# Patient Record
Sex: Female | Born: 1960 | Race: White | Hispanic: No | Marital: Single | Smoking: Current every day smoker
Health system: Southern US, Community
[De-identification: ages and names within clinical notes are randomized; demographics above are authoritative.]

## PROBLEM LIST (undated history)

## (undated) DIAGNOSIS — I10 Essential (primary) hypertension: Secondary | ICD-10-CM

---

## 2013-04-22 ENCOUNTER — Emergency Department (HOSPITAL_BASED_OUTPATIENT_CLINIC_OR_DEPARTMENT_OTHER)
Admission: EM | Admit: 2013-04-22 | Discharge: 2013-04-22 | Disposition: A | Discharge status: Home / Self Care | Payer: 59 | Attending: Emergency Medicine | Admitting: Emergency Medicine

## 2013-04-22 ENCOUNTER — Encounter (HOSPITAL_BASED_OUTPATIENT_CLINIC_OR_DEPARTMENT_OTHER): Payer: Self-pay | Admitting: Emergency Medicine

## 2013-04-22 DIAGNOSIS — Y929 Unspecified place or not applicable: Secondary | ICD-10-CM | POA: Insufficient documentation

## 2013-04-22 DIAGNOSIS — Z23 Encounter for immunization: Secondary | ICD-10-CM | POA: Insufficient documentation

## 2013-04-22 DIAGNOSIS — W010XXA Fall on same level from slipping, tripping and stumbling without subsequent striking against object, initial encounter: Secondary | ICD-10-CM | POA: Insufficient documentation

## 2013-04-22 DIAGNOSIS — S0100XA Unspecified open wound of scalp, initial encounter: Secondary | ICD-10-CM | POA: Insufficient documentation

## 2013-04-22 DIAGNOSIS — F17200 Nicotine dependence, unspecified, uncomplicated: Secondary | ICD-10-CM | POA: Insufficient documentation

## 2013-04-22 DIAGNOSIS — W19XXXA Unspecified fall, initial encounter: Secondary | ICD-10-CM

## 2013-04-22 DIAGNOSIS — S0101XA Laceration without foreign body of scalp, initial encounter: Secondary | ICD-10-CM

## 2013-04-22 DIAGNOSIS — Y939 Activity, unspecified: Secondary | ICD-10-CM | POA: Insufficient documentation

## 2013-04-22 DIAGNOSIS — I10 Essential (primary) hypertension: Secondary | ICD-10-CM | POA: Insufficient documentation

## 2013-04-22 HISTORY — DX: Essential (primary) hypertension: I10

## 2013-04-22 MED ORDER — HYDROCODONE-ACETAMINOPHEN 5-325 MG PO TABS: 2.0000 | ORAL_TABLET | ORAL | Status: DC | PRN

## 2013-04-22 MED ORDER — TETANUS-DIPHTH-ACELL PERTUSSIS 5-2.5-18.5 LF-MCG/0.5 IM SUSP
0.5000 mL | Freq: Once | INTRAMUSCULAR | Status: AC
  Administered 2013-04-22: 0.5 mL via INTRAMUSCULAR
  Filled 2013-04-22: qty 0.5

## 2013-04-22 NOTE — ED Notes (Signed)
Pt states she was wearing fuzzy socks and slipped when walking through the kitchen she hit back of her head on the corner of a desk in the kitchen no loss of consciousness no blurred vision. States her daughter placed ice in it and flushed with peroxide. Bleeding controlled at present.

## 2013-04-22 NOTE — ED Provider Notes (Signed)
CSN: [id number]     Arrival date & time 04/22/13  2130 History   First MD Initiated Contact with Patient 04/22/13 1006     Chief Complaint  Patient presents with  . Head Injury   (Consider location/radiation/quality/duration/timing/severity/associated sxs/prior Treatment) HPI Comments: Patient is a 53 year old female with history of hypertension. She was wearing slippery socks and slipped and fell in the kitchen this morning. She hit the back of her head on the corner of the kitchen counter. There is no loss of consciousness. She denies significant headache, neck pain, or other injury. She sustained a laceration to the back of her scalp. Her last tetanus shot was unknown.  Patient is a 53 y.o. female presenting with head injury. The history is provided by the patient.  Head Injury Location:  Occipital Time since incident:  3 hours Mechanism of injury: fall   Pain details:    Severity:  No pain Chronicity:  New   Past Medical History  Diagnosis Date  . Hypertension    Past Surgical History  Procedure Laterality Date  . Cesarean section     History reviewed. No pertinent family history. History  Substance Use Topics  . Smoking status: Current Every Day Smoker  . Smokeless tobacco: Not on file  . Alcohol Use: Yes     Comment: glass of wine with dinner daily   OB History   Grav Para Term Preterm Abortions TAB SAB Ect Mult Living                 Review of Systems  All other systems reviewed and are negative.    Allergies  Sulfa antibiotics  Home Medications   Current Outpatient Rx  Name  Route  Sig  Dispense  Refill  . ALPRAZolam (XANAX) 0.25 MG tablet   Oral   Take 0.25 mg by mouth at bedtime as needed for anxiety.          BP 122/84  Pulse 96  Temp(Src) 97.5 F (36.4 C) (Oral)  Resp 20  SpO2 100%  LMP 04/02/2013 Physical Exam  Nursing note and vitals reviewed. Constitutional: She is oriented to person, place, and time. She appears well-developed and  well-nourished. No distress.  HENT:  Head: Normocephalic.  There is a 3.5 cm laceration to the occiput. Bleeding is controlled.  TMs are clear without hemotympanum.  Eyes: EOM are normal. Pupils are equal, round, and reactive to light.  Neck: Normal range of motion. Neck supple.  There is no cervical spine tenderness and she has painless range of motion in all directions.  Cardiovascular: Normal rate, regular rhythm and normal heart sounds.   Pulmonary/Chest: Effort normal and breath sounds normal.  Musculoskeletal: Normal range of motion. She exhibits no edema.  Lymphadenopathy:    She has no cervical adenopathy.  Neurological: She is alert and oriented to person, place, and time. No cranial nerve deficit. She exhibits normal muscle tone. Coordination normal.  Skin: Skin is warm and dry. She is not diaphoretic.    ED Course  Procedures (including critical care time) Labs Review Labs Reviewed - No data to display Imaging Review No results found.  LACERATION REPAIR Performed by: Geoffery Lyons Authorized by: Geoffery Lyons Consent: Verbal consent obtained. Risks and benefits: risks, benefits and alternatives were discussed Consent given by: patient Patient identity confirmed: provided demographic data Prepped and Draped in normal sterile fashion Wound explored  Laceration Location: Occiput  Laceration Length: 3.5 cm  No Foreign Bodies seen or palpated  Anesthesia:  local infiltration  Local anesthetic: lidocaine 1 % with epinephrine  Anesthetic total: 2 ml  Irrigation method: syringe Amount of cleaning: standard  Skin closure: Staples   Number of sutures: 5   Technique: Staples   Patient tolerance: Patient tolerated the procedure well with no immediate complications.   MDM  No diagnosis found. Patient presents after a fall with a scalp laceration. Laceration was repaired with staples. Cranial nerves are intact and there is no neck pain. She has painless range  of motion in all directions and her neuro exam is nonfocal. I see no indication for CT scan. She will be discharged with instructions to return if she develops severe headache or other concerning symptoms.    Geoffery Lyonsouglas Aliviya Schoeller, MD 04/22/13 1024

## 2013-04-22 NOTE — Discharge Instructions (Signed)
Staples out in 5 days.  Return to the ER if he develops severe headache, visual changes, vomiting, or other new or concerning symptoms.   Laceration Care, Adult A laceration is a cut or lesion that goes through all layers of the skin and into the tissue just beneath the skin. TREATMENT  Some lacerations may not require closure. Some lacerations may not be able to be closed due to an increased risk of infection. It is important to see your caregiver as soon as possible after an injury to minimize the risk of infection and maximize the opportunity for successful closure. If closure is appropriate, pain medicines may be given, if needed. The wound will be cleaned to help prevent infection. Your caregiver will use stitches (sutures), staples, wound glue (adhesive), or skin adhesive strips to repair the laceration. These tools bring the skin edges together to allow for faster healing and a better cosmetic outcome. However, all wounds will heal with a scar. Once the wound has healed, scarring can be minimized by covering the wound with sunscreen during the day for 1 full year. HOME CARE INSTRUCTIONS  For sutures or staples:  Keep the wound clean and dry.  If you were given a bandage (dressing), you should change it at least once a day. Also, change the dressing if it becomes wet or dirty, or as directed by your caregiver.  Wash the wound with soap and water 2 times a day. Rinse the wound off with water to remove all soap. Pat the wound dry with a clean towel.  After cleaning, apply a thin layer of the antibiotic ointment as recommended by your caregiver. This will help prevent infection and keep the dressing from sticking.  You may shower as usual after the first 24 hours. Do not soak the wound in water until the sutures are removed.  Only take over-the-counter or prescription medicines for pain, discomfort, or fever as directed by your caregiver.  Get your sutures or staples removed as directed  by your caregiver. For skin adhesive strips:  Keep the wound clean and dry.  Do not get the skin adhesive strips wet. You may bathe carefully, using caution to keep the wound dry.  If the wound gets wet, pat it dry with a clean towel.  Skin adhesive strips will fall off on their own. You may trim the strips as the wound heals. Do not remove skin adhesive strips that are still stuck to the wound. They will fall off in time. For wound adhesive:  You may briefly wet your wound in the shower or bath. Do not soak or scrub the wound. Do not swim. Avoid periods of heavy perspiration until the skin adhesive has fallen off on its own. After showering or bathing, gently pat the wound dry with a clean towel.  Do not apply liquid medicine, cream medicine, or ointment medicine to your wound while the skin adhesive is in place. This may loosen the film before your wound is healed.  If a dressing is placed over the wound, be careful not to apply tape directly over the skin adhesive. This may cause the adhesive to be pulled off before the wound is healed.  Avoid prolonged exposure to sunlight or tanning lamps while the skin adhesive is in place. Exposure to ultraviolet light in the first year will darken the scar.  The skin adhesive will usually remain in place for 5 to 10 days, then naturally fall off the skin. Do not pick at the adhesive  film. You may need a tetanus shot if:  You cannot remember when you had your last tetanus shot.  You have never had a tetanus shot. If you get a tetanus shot, your arm may swell, get red, and feel warm to the touch. This is common and not a problem. If you need a tetanus shot and you choose not to have one, there is a rare chance of getting tetanus. Sickness from tetanus can be serious. SEEK MEDICAL CARE IF:   You have redness, swelling, or increasing pain in the wound.  You see a red line that goes away from the wound.  You have yellowish-white fluid (pus) coming  from the wound.  You have a fever.  You notice a bad smell coming from the wound or dressing.  Your wound breaks open before or after sutures have been removed.  You notice something coming out of the wound such as wood or glass.  Your wound is on your hand or foot and you cannot move a finger or toe. SEEK IMMEDIATE MEDICAL CARE IF:   Your pain is not controlled with prescribed medicine.  You have severe swelling around the wound causing pain and numbness or a change in color in your arm, hand, leg, or foot.  Your wound splits open and starts bleeding.  You have worsening numbness, weakness, or loss of function of any joint around or beyond the wound.  You develop painful lumps near the wound or on the skin anywhere on your body. MAKE SURE YOU:   Understand these instructions.  Will watch your condition.  Will get help right away if you are not doing well or get worse. Document Released: 03/06/2005 Document Revised: 05/29/2011 Document Reviewed: 08/30/2010 [facility] Patient Information 2014 [place], [place].  Head Injury, Adult You have received a head injury. It does not appear serious at this time. Headaches and vomiting are common following head injury. It should be easy to awaken from sleeping. Sometimes it is necessary for you to stay in the emergency department for a while for observation. Sometimes admission to the hospital may be needed. After injuries such as yours, most problems occur within the first 24 hours, but side effects may occur up to 7 10 days after the injury. It is important for you to carefully monitor your condition and contact your health care provider or seek immediate medical care if there is a change in your condition. WHAT ARE THE TYPES OF HEAD INJURIES? Head injuries can be as minor as a bump. Some head injuries can be more severe. More severe head injuries include:  A jarring injury to the brain (concussion).  A bruise of the brain (contusion). This  mean there is bleeding in the brain that can cause swelling.  A cracked skull (skull fracture).  Bleeding in the brain that collects, clots, and forms a bump (hematoma). WHAT CAUSES A HEAD INJURY? A serious head injury is most likely to happen to someone who is in a car wreck and is not wearing a seat belt. Other causes of major head injuries include bicycle or motorcycle accidents, sports injuries, and falls. HOW ARE HEAD INJURIES DIAGNOSED? A complete history of the event leading to the injury and your current symptoms will be helpful in diagnosing head injuries. Many times, pictures of the brain, such as CT or MRI are needed to see the extent of the injury. Often, an overnight hospital stay is necessary for observation.  WHEN SHOULD I SEEK IMMEDIATE MEDICAL CARE?  You  should get help right away if:  You have confusion or drowsiness.  You feel sick to your stomach (nauseous) or have continued, forceful vomiting.  You have dizziness or unsteadiness that is getting worse.  You have severe, continued headaches not relieved by medicine. Only take over-the-counter or prescription medicines for pain, fever, or discomfort as directed by your health care provider.  You do not have normal function of the arms or legs or are unable to walk.  You notice changes in the black spots in the center of the colored part of your eye (pupil).  You have a clear or bloody fluid coming from your nose or ears.  You have a loss of vision. During the next 24 hours after the injury, you must stay with someone who can watch you for the warning signs. This person should contact local emergency services (911 in the [place] if you have seizures, you become unconscious, or you are unable to wake up. HOW CAN I PREVENT A HEAD INJURY IN THE FUTURE? The most important factor for preventing major head injuries is avoiding motor vehicle accidents. To minimize the potential for damage to your head, it is crucial to wear seat  belts while riding in motor vehicles. Wearing helmets while bike riding and playing collision sports (like football) is also helpful. Also, avoiding dangerous activities around the house will further help reduce your risk of head injury.  WHEN CAN I RETURN TO NORMAL ACTIVITIES AND ATHLETICS? You should be reevaluated by your health care provider before returning to these activities. If you have any of the following symptoms, you should not return to activities or contact sports until 1 week after the symptoms have stopped:  Persistent headache.  Dizziness or vertigo.  Poor attention and concentration.  Confusion.  Memory problems.  Nausea or vomiting.  Fatigue or tire easily.  Irritability.  Intolerant of bright lights or loud noises.  Anxiety or depression.  Disturbed sleep. MAKE SURE YOU:   Understand these instructions.  Will watch your condition.  Will get help right away if you are not doing well or get worse. Document Released: 03/06/2005 Document Revised: 12/25/2012 Document Reviewed: 11/11/2012 [facility] Patient Information 2014 [place], [place].

## 2021-02-28 ENCOUNTER — Encounter (HOSPITAL_BASED_OUTPATIENT_CLINIC_OR_DEPARTMENT_OTHER): Payer: Self-pay | Admitting: *Deleted

## 2021-02-28 ENCOUNTER — Emergency Department (HOSPITAL_BASED_OUTPATIENT_CLINIC_OR_DEPARTMENT_OTHER): Payer: BC Managed Care – PPO

## 2021-02-28 ENCOUNTER — Emergency Department (HOSPITAL_BASED_OUTPATIENT_CLINIC_OR_DEPARTMENT_OTHER)
Admission: EM | Admit: 2021-02-28 | Discharge: 2021-02-28 | Disposition: A | Discharge status: Home / Self Care | Payer: BC Managed Care – PPO | Attending: Emergency Medicine | Admitting: Emergency Medicine

## 2021-02-28 ENCOUNTER — Other Ambulatory Visit: Payer: Self-pay

## 2021-02-28 DIAGNOSIS — F17200 Nicotine dependence, unspecified, uncomplicated: Secondary | ICD-10-CM | POA: Diagnosis not present

## 2021-02-28 DIAGNOSIS — S42032A Displaced fracture of lateral end of left clavicle, initial encounter for closed fracture: Secondary | ICD-10-CM

## 2021-02-28 DIAGNOSIS — W109XXA Fall (on) (from) unspecified stairs and steps, initial encounter: Secondary | ICD-10-CM | POA: Diagnosis not present

## 2021-02-28 DIAGNOSIS — I10 Essential (primary) hypertension: Secondary | ICD-10-CM | POA: Insufficient documentation

## 2021-02-28 DIAGNOSIS — Y9289 Other specified places as the place of occurrence of the external cause: Secondary | ICD-10-CM | POA: Diagnosis not present

## 2021-02-28 DIAGNOSIS — S40012A Contusion of left shoulder, initial encounter: Secondary | ICD-10-CM | POA: Diagnosis not present

## 2021-02-28 DIAGNOSIS — S40912A Unspecified superficial injury of left shoulder, initial encounter: Secondary | ICD-10-CM | POA: Diagnosis present

## 2021-02-28 MED ORDER — OXYCODONE-ACETAMINOPHEN 5-325 MG PO TABS
1.0000 | ORAL_TABLET | Freq: Once | ORAL | Status: AC
  Administered 2021-02-28: 1 via ORAL
  Filled 2021-02-28: qty 1

## 2021-02-28 MED ORDER — OXYCODONE-ACETAMINOPHEN 5-325 MG PO TABS: 1.0000 | ORAL_TABLET | Freq: Four times a day (QID) | ORAL | 0 refills | Status: DC | PRN

## 2021-02-28 NOTE — ED Provider Notes (Signed)
[facility] EMERGENCY DEPARTMENT Provider Note   CSN: [id number] Arrival date & time: 02/28/21  1635     History Chief Complaint  Patient presents with   Carolyn Leblanc is a 60 y.o. female.   Fall Pertinent negatives include no chest pain, no abdominal pain and no shortness of breath. Patient presents after fall.  Slipped on the steps after attempting to run down him to get back to resume eating.  Landed on her left shoulder.  Complaining of pain in left shoulder.  Did not hit head.  No other injury or complaint at this time.  No neck pain.  Not on blood thinners.  Pain with moving the upper extremity but no numbness or weakness.  No chest pain.     Past Medical History:  Diagnosis Date   Hypertension     There are no problems to display for this patient.   Past Surgical History:  Procedure Laterality Date   CESAREAN SECTION       OB History   No obstetric history on file.     No family history on file.  Social History   Tobacco Use   Smoking status: Every Day  Vaping Use   Vaping Use: Never used  Substance Use Topics   Alcohol use: Yes    Comment: glass of wine with dinner daily   Drug use: No    Home Medications Prior to Admission medications   Medication Sig Start Date End Date Taking? Authorizing Provider  oxyCODONE-acetaminophen (PERCOCET/ROXICET) 5-325 MG tablet Take 1 tablet by mouth every 6 (six) hours as needed for severe pain. 02/28/21  Yes Benjiman Core, MD  ALPRAZolam Prudy Feeler) 0.25 MG tablet Take 0.25 mg by mouth at bedtime as needed for anxiety.    [provider]    Allergies    Sulfa antibiotics  Review of Systems   Review of Systems  Constitutional:  Negative for appetite change.  Respiratory:  Negative for shortness of breath.   Cardiovascular:  Negative for chest pain.  Gastrointestinal:  Negative for abdominal pain.  Genitourinary:  Negative for flank pain.  Musculoskeletal:        Left shoulder  pain.  Skin:  Negative for wound.  Neurological:  Negative for weakness.   Physical Exam Updated Vital Signs BP (!) 147/86 (BP Location: Right Arm)   Pulse 97   Temp 98 F (36.7 C) (Oral)   Resp 18   Ht 5\' 8"  (1.727 m)   Wt 66.2 kg   SpO2 98%   BMI 22.20 kg/m   Physical Exam Vitals and nursing note reviewed.  HENT:     Head: Atraumatic.  Eyes:     Pupils: Pupils are equal, round, and reactive to light.  Cardiovascular:     Rate and Rhythm: Regular rhythm.  Chest:     Chest wall: No tenderness.  Abdominal:     Tenderness: There is no abdominal tenderness.  Musculoskeletal:        General: Tenderness present.     Cervical back: Neck supple. No tenderness.     Comments: Some ecchymosis over lateral left shoulder.  Tenderness over distal clavicle on left.  Decreased range of motion in in shoulder due to pain.  No tenderness over elbow wrist or hand.  Sensation intact in left hand.  Radial pulse strong.  No chest wall tenderness.  Skin:    Capillary Refill: Capillary refill takes less than 2 seconds.  Neurological:  Mental Status: She is alert and oriented to person, place, and time.    ED Results / Procedures / Treatments   Labs (all labs ordered are listed, but only abnormal results are displayed) Labs Reviewed - No data to display  EKG None  Radiology DG Shoulder Left  Result Date: 02/28/2021 CLINICAL DATA:  Fall, rule out fracture EXAM: LEFT SHOULDER - 2+ VIEW COMPARISON:  None. FINDINGS: Displaced fracture of the distal 1/3 of the clavicle. There is approximately 1 cm inferior and 8 mm lateral displacement of the distal fracture fragment. The glenohumeral joint appear maintained. Visualized lungs are clear. IMPRESSION: Displaced fracture of the distal 1/3 of the clavicle. Electronically Signed   By: Larose Hires D.O.   On: 02/28/2021 17:24    Procedures Procedures   Medications Ordered in ED Medications  oxyCODONE-acetaminophen (PERCOCET/ROXICET) 5-325 MG  per tablet 1 tablet (1 tablet Oral Given 02/28/21 1828)    ED Course  I have reviewed the triage vital signs and the nursing notes.  Pertinent labs & imaging results that were available during my care of the patient were reviewed by me and considered in my medical decision making (see chart for details).    MDM Rules/Calculators/A&P                           Patient with fall.  Only apparent injury is left distal clavicle.  Some displacement.  Skin is not tented.  Neurovascular intact.  Sling given.  We will have him follow-up with sports medicine or the orthopedic surgeon that she is seen previously.  Hopefully will not require surgery.  Will discharge home with some pain medicine. Final Clinical Impression(s) / ED Diagnoses Final diagnoses:  Closed displaced fracture of acromial end of left clavicle, initial encounter    Rx / DC Orders ED Discharge Orders          Ordered    oxyCODONE-acetaminophen (PERCOCET/ROXICET) 5-325 MG tablet  Every 6 hours PRN        02/28/21 1841             Benjiman Core, MD 02/28/21 1844

## 2021-02-28 NOTE — ED Triage Notes (Signed)
She slipped on carpet covered steps and fell. Injury to her left shoulder.

## 2021-03-03 ENCOUNTER — Ambulatory Visit: Payer: BC Managed Care – PPO | Admitting: Family Medicine

## 2021-03-03 ENCOUNTER — Encounter: Payer: Self-pay | Admitting: Family Medicine

## 2021-03-03 VITALS — BP 142/80 | Ht 68.0 in | Wt 146.0 lb

## 2021-03-03 DIAGNOSIS — S42032A Displaced fracture of lateral end of left clavicle, initial encounter for closed fracture: Secondary | ICD-10-CM

## 2021-03-03 MED ORDER — HYDROCODONE-ACETAMINOPHEN 5-325 MG PO TABS: 1.0000 | ORAL_TABLET | Freq: Three times a day (TID) | ORAL | 0 refills | Status: AC | PRN

## 2021-03-03 NOTE — Progress Notes (Signed)
°  Carolyn Leblanc - 60 y.o. female MRN [id number]  Date of birth: Dec 08, 1960  SUBJECTIVE:  Including CC & ROS.  No chief complaint on file.   Carolyn Leblanc is a 60 y.o. female that is presenting with left shoulder pain.  She had a fall at her house and hit a vacuum.  She is found to have a fracture of the clavicle.  She is having significant pain and swelling over the area.  Denies any numbness or tingling..  Independent review of the left shoulder x-ray from 12/12 shows a distal displaced clavicle fracture.   Review of Systems See HPI   HISTORY: Past Medical, Surgical, Social, and Family History Reviewed & Updated per EMR.   Pertinent Historical Findings include:  Past Medical History:  Diagnosis Date   Hypertension     Past Surgical History:  Procedure Laterality Date   CESAREAN SECTION      History reviewed. No pertinent family history.  Social History   Socioeconomic History   Marital status: Single    Spouse name: Not on file   Number of children: Not on file   Years of education: Not on file   Highest education level: Not on file  Occupational History   Not on file  Tobacco Use   Smoking status: Every Day   Smokeless tobacco: Not on file  Vaping Use   Vaping Use: Never used  Substance and Sexual Activity   Alcohol use: Yes    Comment: glass of wine with dinner daily   Drug use: No   Sexual activity: Yes  Other Topics Concern   Not on file  Social History Narrative   Not on file   Social Determinants of Health   Financial Resource Strain: Not on file  Food Insecurity: Not on file  Transportation Needs: Not on file  Physical Activity: Not on file  Stress: Not on file  Social Connections: Not on file  Intimate Partner Violence: Not on file     PHYSICAL EXAM:  VS: BP (!) 142/80 (BP Location: Left Arm, Patient Position: Sitting)    Ht 5\' 8"  (1.727 m)    Wt 146 lb (66.2 kg)    BMI 22.20 kg/m  Physical Exam Gen: NAD, alert, cooperative with exam,  well-appearing    ASSESSMENT & PLAN:   Closed displaced fracture of lateral end of left clavicle Injury occurred on 12/12.  Type IIb distal clavicle fracture.  Having a good amount of pain.  -Counseled on home exercise therapy and supportive care. -Norco. -Counseled on sling. -Provided work note. -Referral orthopedist.

## 2021-03-03 NOTE — Assessment & Plan Note (Signed)
Injury occurred on 12/12.  Type IIb distal clavicle fracture.  Having a good amount of pain.  -Counseled on home exercise therapy and supportive care. -Norco. -Counseled on sling. -Provided work note. -Referral orthopedist.

## 2021-03-03 NOTE — Patient Instructions (Signed)
Nice to meet you Please use ice as needed  Please continue the sling  Please use norco as needed   Please send me a message in MyChart with any questions or updates.  Please see me back as needed.   --Dr. Jordan Likes

## 2022-07-05 ENCOUNTER — Encounter: Payer: Self-pay | Admitting: *Deleted

## 2023-08-13 IMAGING — DX DG SHOULDER 2+V*L*
3 series · 3 of 3 positions shown · non-contrast
Comparison: None.

CLINICAL DATA: Fall, rule out fracture

EXAM:
LEFT SHOULDER - 2+ VIEW

[shoulder grashey]
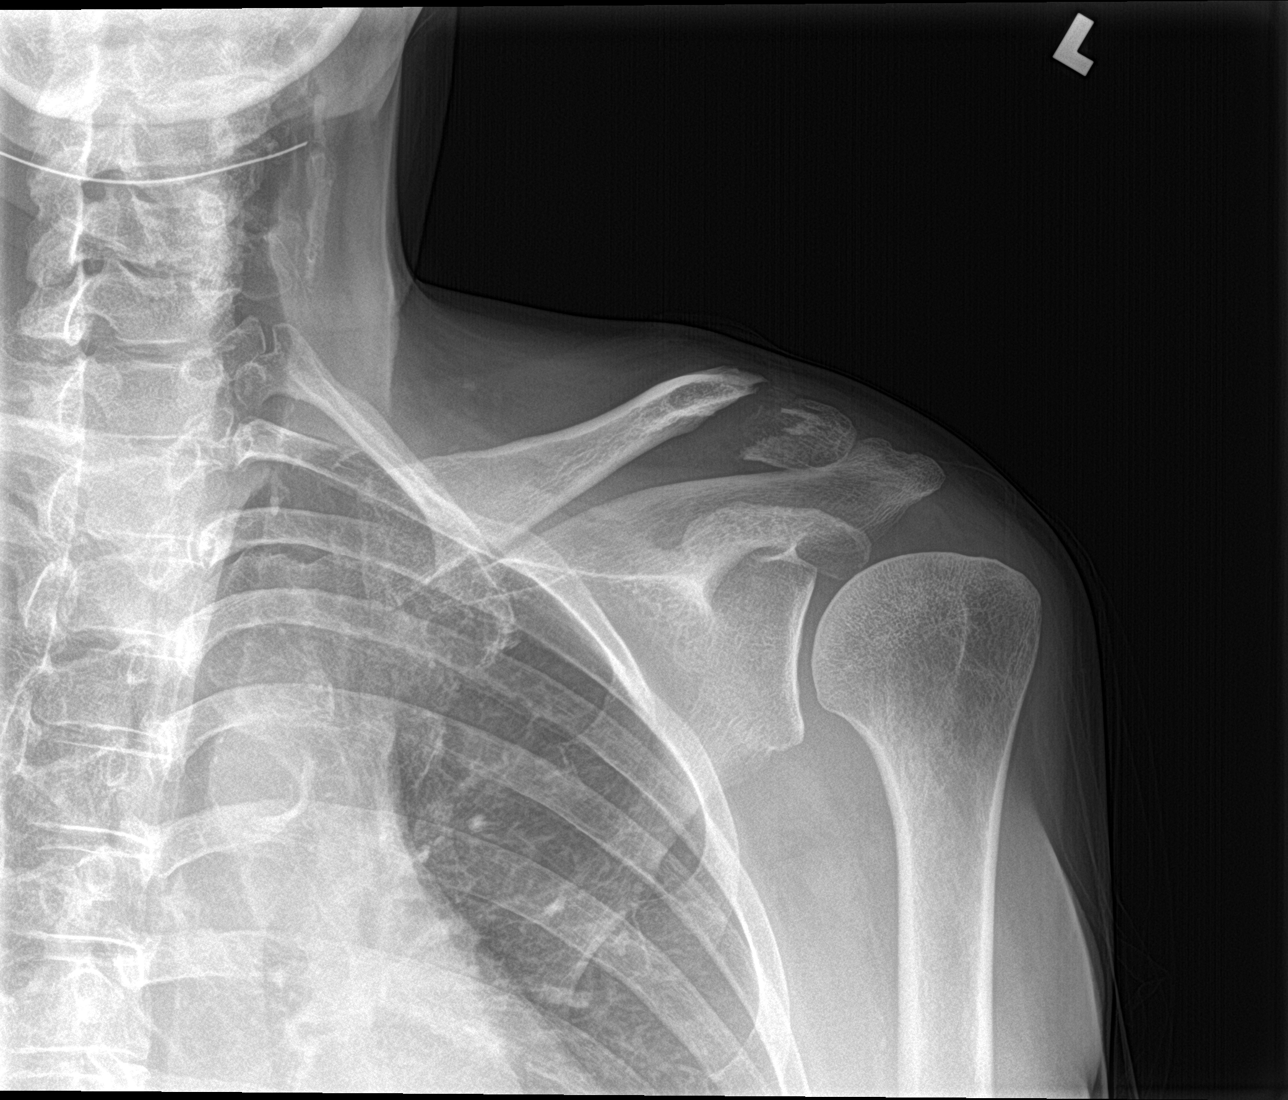

[shoulder y view]
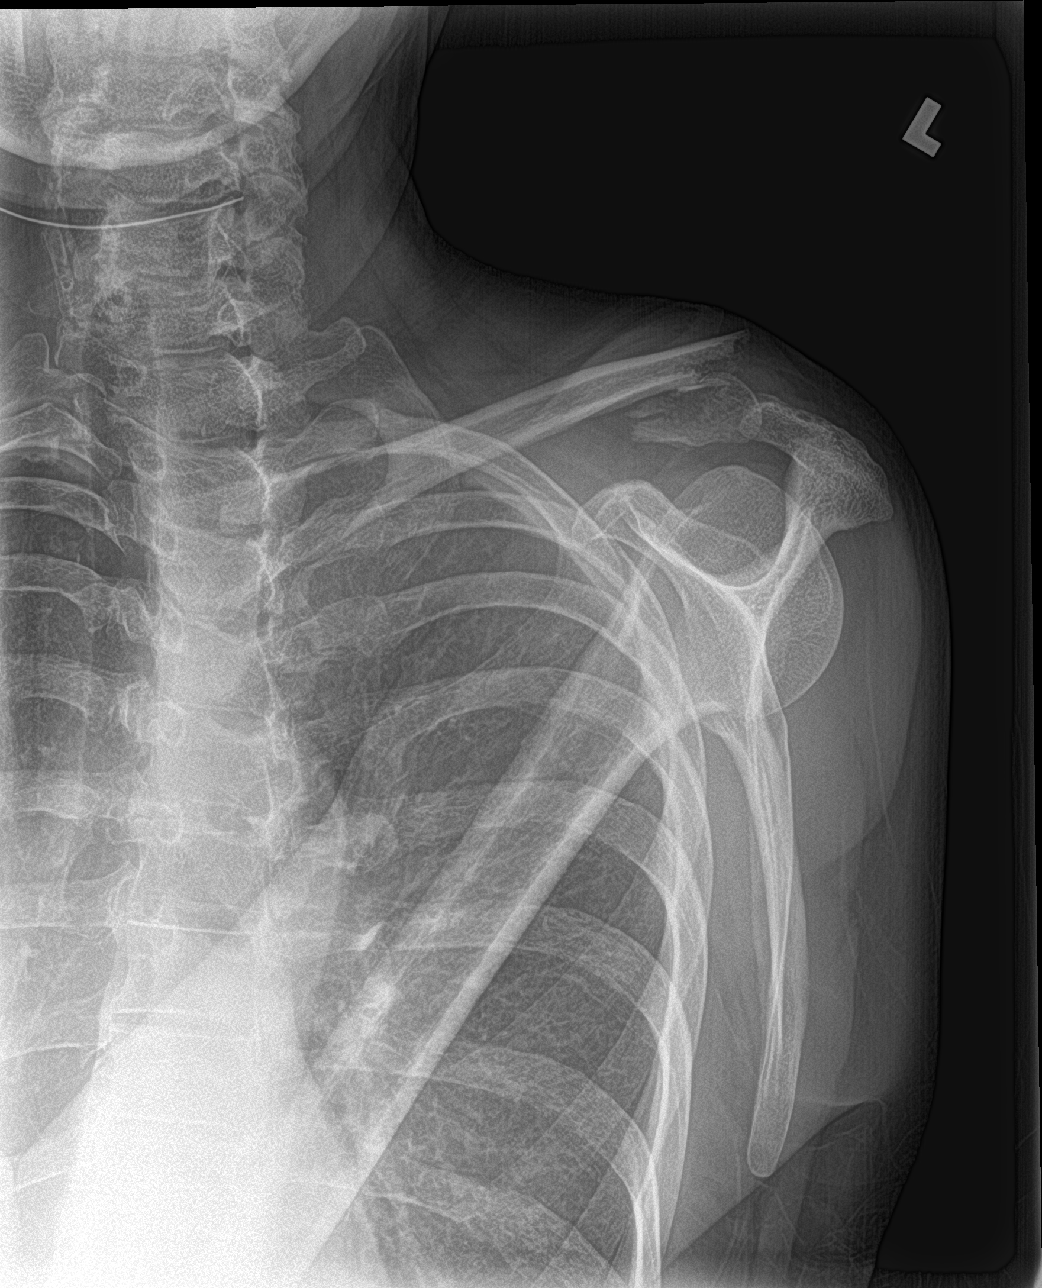

[shoulder ap neutral]
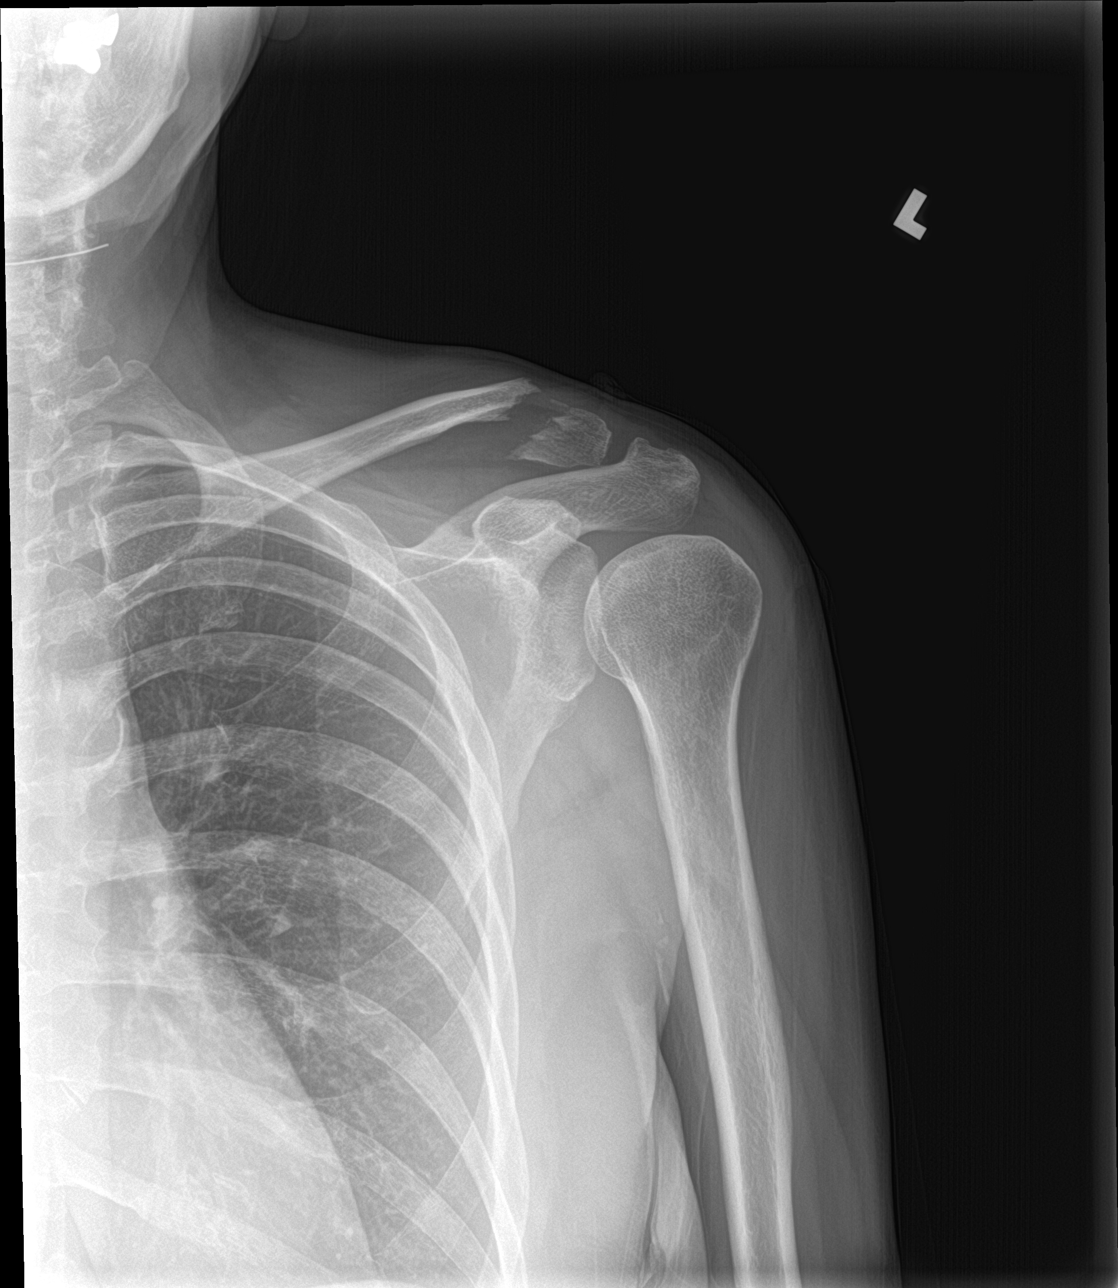

[3 of 3 positions shown; findings below may reference images not displayed]

FINDINGS: Displaced fracture of the distal [DATE] of the clavicle. There is
approximately 1 cm inferior and 8 mm lateral displacement of the
distal fracture fragment. The glenohumeral joint appear maintained.
Visualized lungs are clear.
IMPRESSION: Displaced fracture of the distal [DATE] of the clavicle.
